# Patient Record
Sex: Male | Born: 1940 | Race: White | Marital: Married
Health system: Northeastern US, Academic
[De-identification: ages and names within clinical notes are randomized; demographics above are authoritative.]

---

## 2022-12-11 ENCOUNTER — Ambulatory Visit: Payer: Medicare Other | Attending: Emergency Medicine | Admitting: Emergency Medicine

## 2022-12-11 VITALS — BP 155/72 | HR 71 | Temp 98.4°F | Resp 20

## 2022-12-11 DIAGNOSIS — S0101XD Laceration without foreign body of scalp, subsequent encounter: Secondary | ICD-10-CM | POA: Insufficient documentation

## 2022-12-11 DIAGNOSIS — Z4802 Encounter for removal of sutures: Secondary | ICD-10-CM | POA: Insufficient documentation

## 2022-12-11 NOTE — UC Provider Note (Signed)
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 male with history as noted in chart presents to Urgent Care for suture removal from scalp.     He had a MOHs surgery completed and has 11 external sutures that need to be removed.    VITALS:  BP 155/72   Pulse 71   Temp 36.9 C (98.4 F)   Resp 20   SpO2 97%     ROS: see above / below, otherwise complete ROS negative   Denies: fever, chills, redness, warmth, swelling, streaking, purulent drainage, other drainage, bleeding, and significant tenderness     PHYSICAL EXAM:  Appearance: Well appearing, no acute distress   Skin: 11 sutures in place. Wound healing well, 1cm scabbed area. There is no surrounding erythema, tenderness, warmth, swelling, bleeding, drainage or streaking.    Neuro: Normal sensation and strength       MEDICAL DECISION MAKING:  Assessment: 82 y.o. male with history as noted in chart presents to UC for suture removal from scalp.     Differential Diagnosis: Laceration, no signs of wound infection or dehiscence      Plan and Results:   sutures removed.  See procedure note for details.   Tetanus status reviewed.   Antibiotic ointment and dressing reviewed  Wound care reviewed in detail and appropriate info provided   Continue to monitor for signs of infection and follow up as indicated per discharge instructions     Orders Placed This Encounter    Suture removal       Diagnosis and Disposition:  1. Encounter for removal of sutures            No follow-ups on file.    Given the patient's reassuring vital signs and overall well appearance, the patient can be managed safely at home. Patient advised to call PCP or return to urgent care for any worsening or concerning  symptoms as reviewed and provided on AVS.

## 2022-12-11 NOTE — Procedures (Signed)
Suture removal    Date/Time: 12/11/2022 3:15 PM    Performed by: Marcelle Smiling, PA  Authorized by: Marcelle Smiling, PA    Location:     Location:  Head/neck    Head/neck location:  Scalp  Procedure details:     Wound appearance:  Clean    Sutures removed?:  yes (11)  Post-procedure details:     Patient tolerance of procedure:  Tolerated well, no immediate complications  Comments:      Procedure completed by myself and Melvenia Beam PA-S

## 2022-12-11 NOTE — Patient Instructions (Signed)
We have removed the 11 sutures today    Keep area clean and dry    Follow up as needed

## 2023-10-03 IMAGING — MR MULTI PARAMETRIC MRI PROSTATE W/O AND W CONTRAST
11 series · 48 of 48 positions shown · IV contrast (gadavist)
Comparison: None

________________________________________________________________________________________________ 
MULTI PARAMETRIC MRI PROSTATE W/O AND W CONTRAST, 10/03/2023 [DATE]: 
CLINICAL INDICATION: Elevated Prostate Specific Antigen [psa]
TECHNIQUE: Multiple parametric sequences were performed Pre-contrast: T1 axial 
of the entire pelvis. T2 sagittal, axial  and coronal,T1 axial, acquired of the 
prostate. Diffusion with multiple B values of 9000, 5211 calculated ADC value 
for mapping. Post contrast: Rapid sequence dynamic and axial planes through the 
prostate and seminal vesicles,T1 axial with fat sat of the entire pelvis. 3-D 
renderings were reconstructed on an independent workstation. The images were 
also evaluated with Dyna CAD computer aided detection. 8 mL of Gadavist were 
injected intravenously. 2 mL of Gadavist discarded. As per [HOSPITAL] guidelines 3D reconstructions are performed with concurrent physician 
supervision.

[Series 101: survey-mst · axial · 10.0mm · 1.34mm/px · 1 of 14 slices shown]
[im 1/14]
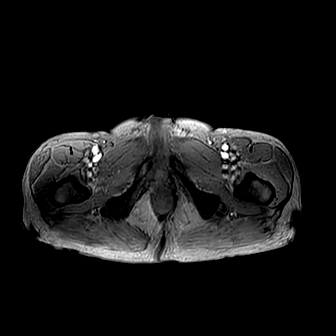

[Series 201: t1w_tse_ax · axial · 6.0mm · 0.37mm/px · 1 of 36 slices shown]
[im 1/36]
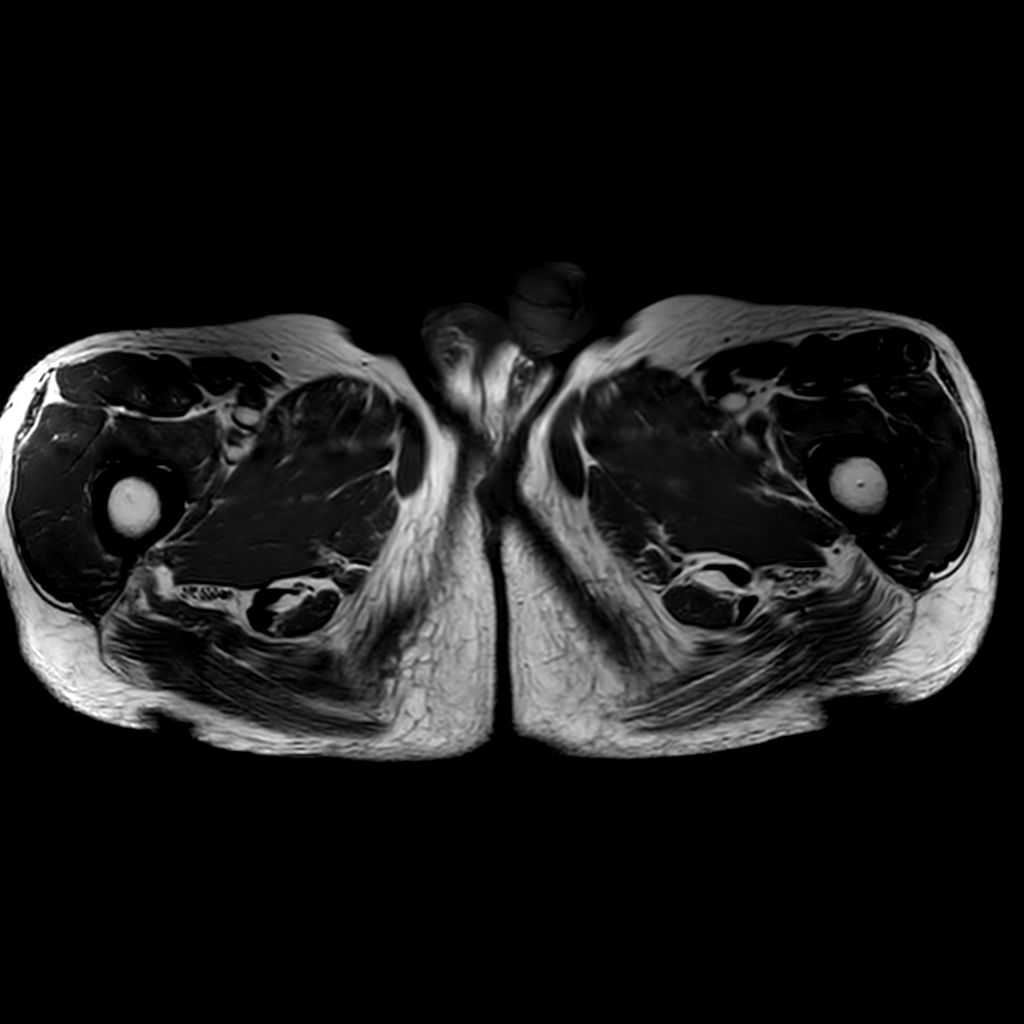

[Series 301: t2w cor · coronal · 3.0mm · 0.42mm/px · 1 of 30 slices shown]
[im 1/30]
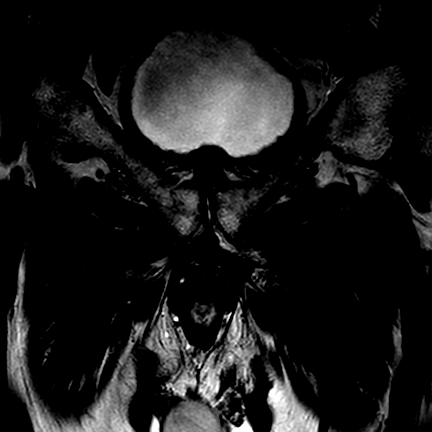

[Series 401: t2w ax · axial · 3.0mm · 0.38mm/px · 1 of 32 slices shown]
[im 1/32]
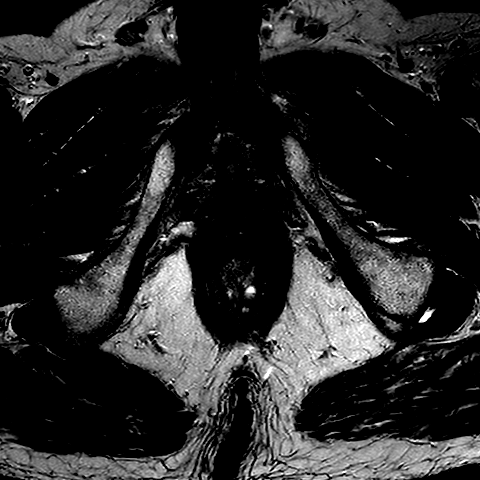

[Series 501: new-dwi_3b* 3mm* · axial · 3.0mm · 1.28mm/px · 1 of 64 slices shown]
[im 1/64]
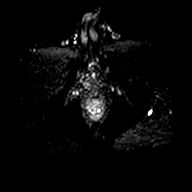

[Series 502: ADC · axial · 3.0mm · 1.28mm/px · 1 of 31 slices shown (1 of 2)]
[im 1/31]
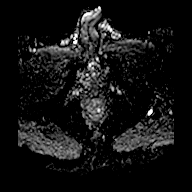

[Series 503: ADC · axial · 3.0mm · 1.28mm/px · 1 of 32 slices shown (2 of 2)]
[im 1/32]
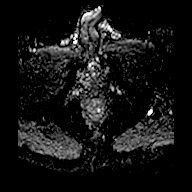

[Series 504: (id) · axial · 3.0mm · 1.28mm/px · 1 of 32 slices shown (1 of 3)]
[im 1/32]
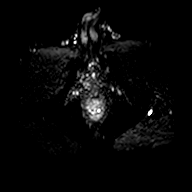

[Series 505: (id) · axial · 3.0mm · 1.28mm/px · 1 of 32 slices shown (2 of 3)]
[im 1/32]
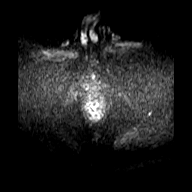

[Series 601: (id) · axial · 3.0mm · 1.28mm/px · 1 of 32 slices shown (3 of 3)]
[im 1/32]
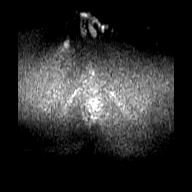

[Series 801: dyn 3mm*(ap) · axial · 3.0mm · 1.38mm/px · z∈[-18,+75]mm · 38 of 1440 slices shown]
[im 1/1440]
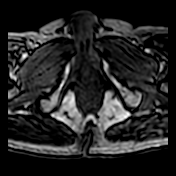
[im 39/1440]
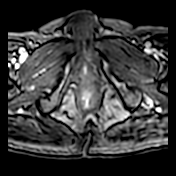
[im 78/1440]
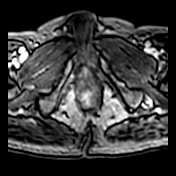
[im 117/1440]
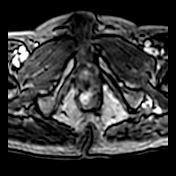
[im 156/1440]
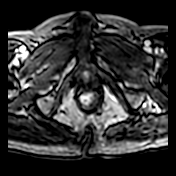
[im 195/1440]
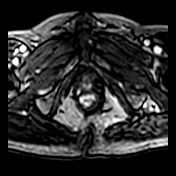
[im 234/1440]
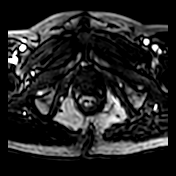
[im 273/1440]
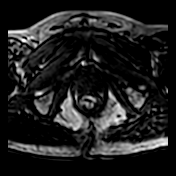
[im 312/1440]
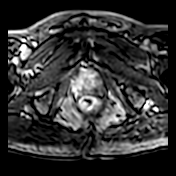
[im 351/1440]
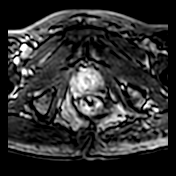
[im 389/1440]
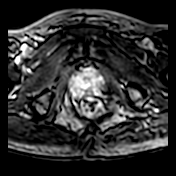
[im 428/1440]
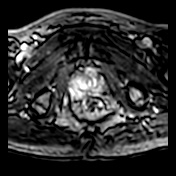
[im 467/1440]
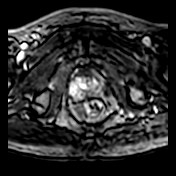
[im 506/1440]
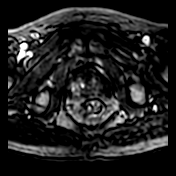
[im 545/1440]
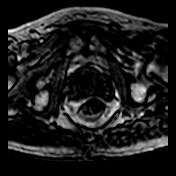
[im 584/1440]
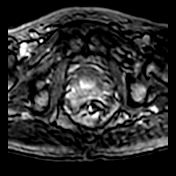
[im 623/1440]
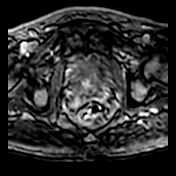
[im 662/1440]
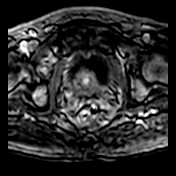
[im 701/1440]
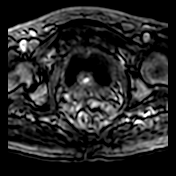
[im 739/1440]
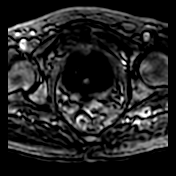
[im 778/1440]
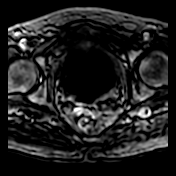
[im 817/1440]
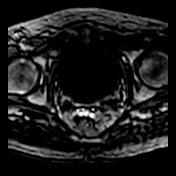
[im 856/1440]
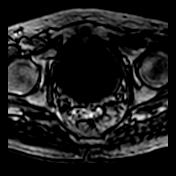
[im 895/1440]
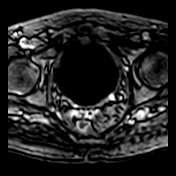
[im 934/1440]
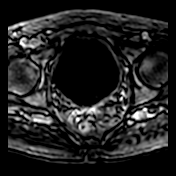
[im 973/1440]
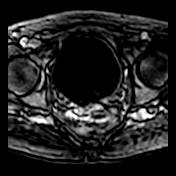
[im 1012/1440]
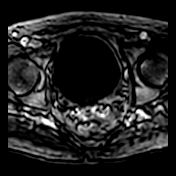
[im 1051/1440]
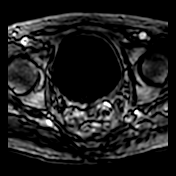
[im 1089/1440]
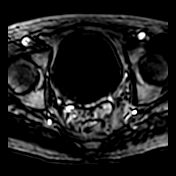
[im 1128/1440]
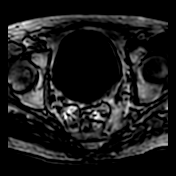
[im 1167/1440]
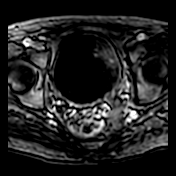
[im 1206/1440]
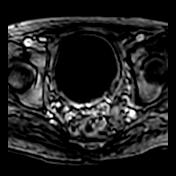
[im 1245/1440]
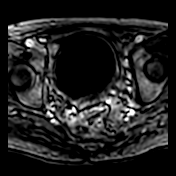
[im 1284/1440]
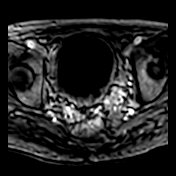
[im 1323/1440]
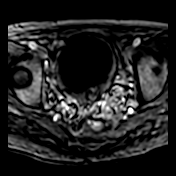
[im 1362/1440]
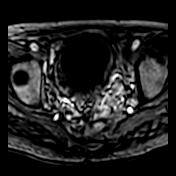
[im 1401/1440]
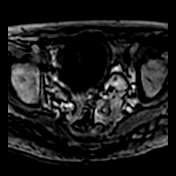
[im 1440/1440]
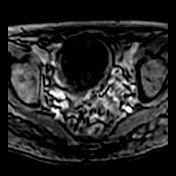

[48 of 48 positions shown; findings below may reference images not displayed]

FINDINGS: VOLUME: 31 cc 
CENTRAL GLAND: There is a concerning area of decreased T2 signal anteriorly 
within the apical transition zone. This is slightly asymmetric to the left. This 
shows decreased T2 signal with significant restricted diffusion. Measures at 
least 1.4 cm and diameter. No additional lesions seen within the central gland. 
PERIPHERAL ZONE: Right lateral and posterior lateral decreased T2 signal from 
the apex all the way through the base. On coronal image 18 measures up to 2.5 cm 
and shows significant restricted diffusion. No left-sided lesions identified. 
PROSTATE CAPSULE: The lesion anteriorly within the central gland does bulge the 
capsule anteriorly and to the left. The largest lesion involving the right 
peripheral zone abuts the capsule for well over centimeter without extracapsular 
extension identified. 
MUSCLE SIDE WALLS: Normal in appearance. 
SEMINAL VESICLES: Normal in appearance. 
BLADDER: Mildly thickened and trabeculated. 
LYMPHADENOPATHY: No suspicious adenopathy identified. 
BONES: No osseous lesion identified. 
ADDITIONAL FINDINGS: Degenerative changes lower lumbar spine.
IMPRESSION: Findings concerning for malignancy anterior transition zone and throughout the 
right peripheral zone as discussed above. 
(PI-RADS 5): Very high (clinically significant cancer is highly likely to be 
present).

## 8387-02-17 DEATH — deceased
